# Patient Record
Sex: Female | Born: 1955 | Race: White | Hispanic: No | Marital: Married | Smoking: Never smoker
Health system: Southern US, Community
[De-identification: ages and names within clinical notes are randomized; demographics above are authoritative.]

---

## 2013-11-26 ENCOUNTER — Other Ambulatory Visit (HOSPITAL_COMMUNITY)
Admission: RE | Admit: 2013-11-26 | Discharge: 2013-11-26 | Disposition: A | Discharge status: Home / Self Care | Payer: BC Managed Care – PPO | Source: Ambulatory Visit | Attending: Family Medicine | Admitting: Family Medicine

## 2013-11-26 DIAGNOSIS — Z01419 Encounter for gynecological examination (general) (routine) without abnormal findings: Secondary | ICD-10-CM | POA: Insufficient documentation

## 2014-06-17 ENCOUNTER — Other Ambulatory Visit: Payer: Self-pay

## 2014-06-17 DIAGNOSIS — Z1231 Encounter for screening mammogram for malignant neoplasm of breast: Secondary | ICD-10-CM

## 2014-06-26 ENCOUNTER — Ambulatory Visit
Admission: RE | Admit: 2014-06-26 | Discharge: 2014-06-26 | Disposition: A | Discharge status: Home / Self Care | Payer: BC Managed Care – PPO | Source: Ambulatory Visit

## 2014-06-26 DIAGNOSIS — Z1231 Encounter for screening mammogram for malignant neoplasm of breast: Secondary | ICD-10-CM

## 2015-03-27 ENCOUNTER — Ambulatory Visit (INDEPENDENT_AMBULATORY_CARE_PROVIDER_SITE_OTHER): Payer: BC Managed Care – PPO | Admitting: Podiatry

## 2015-03-27 ENCOUNTER — Encounter: Payer: Self-pay | Admitting: Podiatry

## 2015-03-27 VITALS — BP 142/66 | HR 61 | Resp 16

## 2015-03-27 DIAGNOSIS — L603 Nail dystrophy: Secondary | ICD-10-CM | POA: Diagnosis not present

## 2015-03-27 NOTE — Patient Instructions (Signed)

## 2015-03-27 NOTE — Progress Notes (Signed)
   Subjective:    Patient ID: Megan Hutchinson, female    DOB: [birth year], 59 y.o.   MRN: [id number]  HPI Comments: "I have 2 thick nails"  Patient c/o tender 1st toenails bilateral for several months. The nails are thick and discolored. She has tried to clean around and under them. Used coconut oil-no help.     Review of Systems  All other systems reviewed and are negative.      Objective:   Physical Exam: I have reviewed her past medical history medications allergies surgery social history and review of systems. Pulses are strongly palpable bilateral. Neurologic sensorium is intact per Semmes-Weinstein monofilament. Deep tendon reflexes are intact bilateral muscle strength +5 over 5 dorsiflexion plantar flexors and inverters everters all intrinsic musculature is intact. Orthopedic evaluation demonstrates all joints distal to the ankle had a full range of motion without crepitation. Cutaneous evaluation of a straight supple well-hydrated cutis with no erythema edema saline as drainage or odor. The hallux nail plates appear to be thickened and have a lateral angular deviation. With subungual debris present and malodor.        Assessment & Plan:  Assessment: Nail dystrophy Rule out onychomycosis.  Plan: Sample of the nail and skin were taken today to be sent for pathologic evaluation we will notify her upon their return.

## 2015-05-13 ENCOUNTER — Encounter: Payer: Self-pay | Admitting: Podiatry

## 2015-05-13 ENCOUNTER — Ambulatory Visit (INDEPENDENT_AMBULATORY_CARE_PROVIDER_SITE_OTHER): Payer: BC Managed Care – PPO | Admitting: Podiatry

## 2015-05-13 VITALS — BP 131/68 | HR 71 | Resp 16

## 2015-05-13 DIAGNOSIS — L603 Nail dystrophy: Secondary | ICD-10-CM

## 2015-05-13 DIAGNOSIS — B351 Tinea unguium: Secondary | ICD-10-CM | POA: Diagnosis not present

## 2015-05-13 LAB — CBC WITH DIFFERENTIAL/PLATELET
Basophils Absolute: 0.1 10*3/uL (ref 0.0–0.1)
Basophils Relative: 1 % (ref 0–1)
EOS PCT: 6 % — AB (ref 0–5)
Eosinophils Absolute: 0.4 10*3/uL (ref 0.0–0.7)
HCT: 39.4 % (ref 36.0–46.0)
HEMOGLOBIN: 13.6 g/dL (ref 12.0–15.0)
LYMPHS ABS: 2.3 10*3/uL (ref 0.7–4.0)
LYMPHS PCT: 33 % (ref 12–46)
MCH: 31.1 pg (ref 26.0–34.0)
MCHC: 34.5 g/dL (ref 30.0–36.0)
MCV: 90 fL (ref 78.0–100.0)
MONOS PCT: 7 % (ref 3–12)
MPV: 9.7 fL (ref 8.6–12.4)
Monocytes Absolute: 0.5 10*3/uL (ref 0.1–1.0)
Neutro Abs: 3.8 10*3/uL (ref 1.7–7.7)
Neutrophils Relative %: 53 % (ref 43–77)
Platelets: 281 10*3/uL (ref 150–400)
RBC: 4.38 MIL/uL (ref 3.87–5.11)
RDW: 13.1 % (ref 11.5–15.5)
WBC: 7.1 10*3/uL (ref 4.0–10.5)

## 2015-05-13 LAB — HEPATIC FUNCTION PANEL
ALT: 18 U/L (ref 0–35)
AST: 16 U/L (ref 0–37)
Albumin: 4.2 g/dL (ref 3.5–5.2)
Alkaline Phosphatase: 46 U/L (ref 39–117)
BILIRUBIN INDIRECT: 0.3 mg/dL (ref 0.2–1.2)
Bilirubin, Direct: 0.1 mg/dL (ref 0.0–0.3)
TOTAL PROTEIN: 7.1 g/dL (ref 6.0–8.3)
Total Bilirubin: 0.4 mg/dL (ref 0.2–1.2)

## 2015-05-13 MED ORDER — ITRACONAZOLE 100 MG PO CAPS: 100.0000 mg | ORAL_CAPSULE | Freq: Two times a day (BID) | ORAL | Status: DC

## 2015-05-13 NOTE — Progress Notes (Signed)
Patient presents today for follow-up of fungal culture for the toenails.  Objective: Onychomycosis pathology report.  Assessment: Onychomycosis.  Plan: Treatment with Itraconazole 100 mg tablets 1 by mouth twice a day x 3 months. We have also requested blood work consisting of a liver profile and a CBC. Patient will be notified if results are abnormal. I will follow-up with patient in 1 month for another liver profile and CBC. Should she have any questions or concerns regarding this medication she will notify us immediately.

## 2015-05-13 NOTE — Patient Instructions (Signed)
Itraconazole capsules and tablets What is this medicine? ITRACONAZOLE (i tra KO na zole) is an antifungal medicine. It is used to treat certain kinds of fungal or yeast infections. This medicine may be used for other purposes; ask your health care provider or pharmacist if you have questions. COMMON BRAND NAME(S): ONMEL, Sporanox What should I tell my health care provider before I take this medicine? They need to know if you have any of these conditions: -heart disease, including angina or heart failure -history of irregular heartbeat -kidney disease or on dialysis -liver disease -lung disease -an unusual or allergic reaction to itraconazole, or other antifungal medicines, foods, dyes or preservatives -pregnant or trying to get pregnant -breast-feeding How should I use this medicine? Take this medicine by mouth with a full glass of water. Follow the directions on the prescription label. Take after eating a full meal. If you have a condition called achlorhydria, are taking H2-receptor antagonists or other gastric acid suppressors, you should take this medicine with a cola beverage. Take your doses at regular intervals. Do not take your medicine more often than directed. Do not stop taking except on your doctor's advice. Talk to your pediatrician regarding the use of this medicine in children. Special care may be needed. Overdosage: If you think you have taken too much of this medicine contact a poison control center or emergency room at once. NOTE: This medicine is only for you. Do not share this medicine with others. What if I miss a dose? If you miss a dose, take it as soon as you can. If it is almost time for your next dose, take only that dose. Do not take double or extra doses. What may interact with this medicine? Do not take this medicine with any of the following medications: -alfuzosin -cisapride -colchicine -ergot alkaloids like dihydroergotamine, ergonovine, ergotamine,  methylergonovine -methadone -nevirapine -pimozide -red yeast rice -sirolimus -some medicines for anxiety or sleep like alprazolam, clorazepate, flurazepam, midazolam, triazolam -some medicines for blood pressure like felodipine and nisoldipine -some medicines for cancer treatment like irinotecan -some medicines for cholesterol like atorvastatin, cerivastatin, lovastatin, simvastatin -some medicines for the heart like conivaptan, disopyramide, dofetilide, dronedarone, eplerenone, quinidine, ranolazine -some medicines for psychotic disturbances like lurasidone This medicine may also interact with the following medications: -alfentanil -antacids -antiviral medicines for HIV or AIDS -budesonide -buspirone -cilostazol -cyclosporine -digoxin -eletriptan -erythromycin -fentanyl -fluticasone -halofantrine -medicines for blood pressure like amlodipine and nifedipine -medicines for stomach problems like cimetidine, famotidine, omeprazole, lansoprazole -medicines for tuberculosis like isoniazid, INH, rifabutin, rifampin, rifapentine -medicines that treat or prevent blood clots like warfarin -methylprednisolone -other medicines for fungal infections -phenytoin, fosphenytoin -some medicines for diabetes -tacrolimus -trimetrexate This list may not describe all possible interactions. Give your health care provider a list of all the medicines, herbs, non-prescription drugs, or dietary supplements you use. Also tell them if you smoke, drink alcohol, or use illegal drugs. Some items may interact with your medicine. What should I watch for while using this medicine? Visit your doctor or health care professional for check ups. Tell your doctor or healthcare professional if your symptoms do not start to get better or if they get worse. Some fungal infections can take many weeks or months of treatment to cure. Avoid medicines for your stomach like antacids, anticholinergics, and acid blockers for at  least 2 hours after taking this medicine. You may get dizzy or blurred/double vision when taking this medicine. Do not drive, use machinery, or do anything that may be dangerous   until you know how the medicine affects you. Do not stand or sit up quickly, especially if you are an older patient. What side effects may I notice from receiving this medicine? Side effects that you should report to your doctor or health care professional as soon as possible: -allergic reactions like skin rash, itching or hives, swelling of the face, lips, or tongue -breathing problems -changes in hearing -cough up mucus -dark urine -fast, irregular heartbeat -general ill feeling or flu-like symptoms -light-colored stools -loss of appetite -nausea, vomiting -pain, tingling, numbness in the hands or feet -redness, blistering, peeling or loosening of the skin, including inside the mouth -right upper belly pain -sudden weight gain -swelling in feet, ankles, or legs -unusually weak or tired -yellowing of the eyes or skin Side effects that usually do not require medical attention (report to your doctor or health care professional if they continue or are bothersome): -blurred/double vision -diarrhea -dizziness -headache -stomach upset or bloating This list may not describe all possible side effects. Call your doctor for medical advice about side effects. You may report side effects to FDA at 1-800-FDA-1088. Where should I keep my medicine? Keep out of the reach of children. Store at room temperature between 15 and 25 degrees C (59 and 77 degrees F). Protect from light and moisture. Throw away any unused medicine after the expiration date. NOTE: This sheet is a summary. It may not cover all possible information. If you have questions about this medicine, talk to your doctor, pharmacist, or health care provider.  2015, [employer]. (2013-05-21 17:02:46)  

## 2015-05-15 ENCOUNTER — Telehealth: Payer: Self-pay | Admitting: *Deleted

## 2015-05-15 DIAGNOSIS — Z79899 Other long term (current) drug therapy: Secondary | ICD-10-CM

## 2015-05-15 NOTE — Telephone Encounter (Signed)
I informed pt 05/13/2015 Hepatic function and CBC with diff were within normal limits and she could begin her medication, and would need a check in 1 month.  Pt states it cost her $60.00 each visit, could she have the 1 month labs without the visit.  Dr. Maren Beach.  Reordered labs and mailed to pt.

## 2015-05-29 ENCOUNTER — Other Ambulatory Visit: Payer: Self-pay

## 2015-05-29 DIAGNOSIS — Z1231 Encounter for screening mammogram for malignant neoplasm of breast: Secondary | ICD-10-CM

## 2015-06-12 ENCOUNTER — Ambulatory Visit: Payer: BC Managed Care – PPO | Admitting: Podiatry

## 2015-06-21 LAB — HEPATIC FUNCTION PANEL
ALT: 16 U/L (ref 6–29)
AST: 14 U/L (ref 10–35)
Albumin: 4.2 g/dL (ref 3.6–5.1)
Alkaline Phosphatase: 44 U/L (ref 33–130)
BILIRUBIN INDIRECT: 0.4 mg/dL (ref 0.2–1.2)
Bilirubin, Direct: 0.1 mg/dL (ref <=0.2)
Total Bilirubin: 0.5 mg/dL (ref 0.2–1.2)
Total Protein: 7.2 g/dL (ref 6.1–8.1)

## 2015-06-21 LAB — CBC WITH DIFFERENTIAL/PLATELET
BASOS PCT: 1 % (ref 0–1)
Basophils Absolute: 0.1 10*3/uL (ref 0.0–0.1)
EOS ABS: 0.4 10*3/uL (ref 0.0–0.7)
EOS PCT: 6 % — AB (ref 0–5)
HCT: 41 % (ref 36.0–46.0)
Hemoglobin: 13.9 g/dL (ref 12.0–15.0)
LYMPHS ABS: 2.2 10*3/uL (ref 0.7–4.0)
Lymphocytes Relative: 33 % (ref 12–46)
MCH: 30.5 pg (ref 26.0–34.0)
MCHC: 33.9 g/dL (ref 30.0–36.0)
MCV: 90.1 fL (ref 78.0–100.0)
MONOS PCT: 6 % (ref 3–12)
MPV: 9.5 fL (ref 8.6–12.4)
Monocytes Absolute: 0.4 10*3/uL (ref 0.1–1.0)
NEUTROS PCT: 54 % (ref 43–77)
Neutro Abs: 3.7 10*3/uL (ref 1.7–7.7)
PLATELETS: 263 10*3/uL (ref 150–400)
RBC: 4.55 MIL/uL (ref 3.87–5.11)
RDW: 13.5 % (ref 11.5–15.5)
WBC: 6.8 10*3/uL (ref 4.0–10.5)

## 2015-06-23 NOTE — Telephone Encounter (Signed)
Called and informed pt.  

## 2015-06-24 ENCOUNTER — Telehealth: Payer: Self-pay | Admitting: *Deleted

## 2015-06-25 NOTE — Telephone Encounter (Signed)
Entered in error

## 2015-07-07 ENCOUNTER — Ambulatory Visit: Payer: BC Managed Care – PPO

## 2015-08-06 ENCOUNTER — Ambulatory Visit
Admission: RE | Admit: 2015-08-06 | Discharge: 2015-08-06 | Disposition: A | Discharge status: Home / Self Care | Payer: BC Managed Care – PPO | Source: Ambulatory Visit

## 2015-08-06 DIAGNOSIS — Z1231 Encounter for screening mammogram for malignant neoplasm of breast: Secondary | ICD-10-CM

## 2017-02-23 ENCOUNTER — Other Ambulatory Visit (HOSPITAL_COMMUNITY)
Admission: RE | Admit: 2017-02-23 | Discharge: 2017-02-23 | Disposition: A | Discharge status: Home / Self Care | Payer: BC Managed Care – PPO | Source: Ambulatory Visit | Attending: Family Medicine | Admitting: Family Medicine

## 2017-02-23 ENCOUNTER — Other Ambulatory Visit: Payer: Self-pay | Admitting: Family Medicine

## 2017-02-23 DIAGNOSIS — Z1151 Encounter for screening for human papillomavirus (HPV): Secondary | ICD-10-CM | POA: Insufficient documentation

## 2017-02-23 DIAGNOSIS — Z01411 Encounter for gynecological examination (general) (routine) with abnormal findings: Secondary | ICD-10-CM | POA: Insufficient documentation

## 2017-03-01 LAB — CYTOLOGY - PAP
Diagnosis: NEGATIVE
HPV: NOT DETECTED

## 2017-06-02 ENCOUNTER — Other Ambulatory Visit: Payer: Self-pay | Admitting: Family Medicine

## 2017-06-02 DIAGNOSIS — Z1231 Encounter for screening mammogram for malignant neoplasm of breast: Secondary | ICD-10-CM

## 2017-06-10 ENCOUNTER — Ambulatory Visit
Admission: RE | Admit: 2017-06-10 | Discharge: 2017-06-10 | Disposition: A | Discharge status: Home / Self Care | Payer: BC Managed Care – PPO | Source: Ambulatory Visit | Attending: Family Medicine | Admitting: Family Medicine

## 2017-06-10 DIAGNOSIS — Z1231 Encounter for screening mammogram for malignant neoplasm of breast: Secondary | ICD-10-CM

## 2018-10-05 ENCOUNTER — Other Ambulatory Visit: Payer: Self-pay | Admitting: Family Medicine

## 2018-10-05 DIAGNOSIS — Z1231 Encounter for screening mammogram for malignant neoplasm of breast: Secondary | ICD-10-CM

## 2018-12-04 ENCOUNTER — Ambulatory Visit
Admission: RE | Admit: 2018-12-04 | Discharge: 2018-12-04 | Disposition: A | Discharge status: Home / Self Care | Payer: BC Managed Care – PPO | Source: Ambulatory Visit | Attending: Family Medicine | Admitting: Family Medicine

## 2018-12-04 DIAGNOSIS — Z1231 Encounter for screening mammogram for malignant neoplasm of breast: Secondary | ICD-10-CM

## 2020-04-03 ENCOUNTER — Ambulatory Visit: Payer: BC Managed Care – PPO | Admitting: Podiatry

## 2020-07-16 ENCOUNTER — Emergency Department (HOSPITAL_COMMUNITY)
Admission: EM | Admit: 2020-07-16 | Discharge: 2020-07-17 | Disposition: A | Discharge status: Home / Self Care | Payer: BC Managed Care – PPO | Attending: Emergency Medicine | Admitting: Emergency Medicine

## 2020-07-16 ENCOUNTER — Emergency Department (HOSPITAL_COMMUNITY): Payer: BC Managed Care – PPO

## 2020-07-16 ENCOUNTER — Other Ambulatory Visit: Payer: Self-pay

## 2020-07-16 ENCOUNTER — Encounter (HOSPITAL_COMMUNITY): Payer: Self-pay | Admitting: *Deleted

## 2020-07-16 DIAGNOSIS — Y9289 Other specified places as the place of occurrence of the external cause: Secondary | ICD-10-CM | POA: Insufficient documentation

## 2020-07-16 DIAGNOSIS — R2231 Localized swelling, mass and lump, right upper limb: Secondary | ICD-10-CM | POA: Diagnosis not present

## 2020-07-16 DIAGNOSIS — Y939 Activity, unspecified: Secondary | ICD-10-CM | POA: Diagnosis not present

## 2020-07-16 DIAGNOSIS — X58XXXA Exposure to other specified factors, initial encounter: Secondary | ICD-10-CM | POA: Diagnosis not present

## 2020-07-16 DIAGNOSIS — M7989 Other specified soft tissue disorders: Secondary | ICD-10-CM

## 2020-07-16 DIAGNOSIS — Y999 Unspecified external cause status: Secondary | ICD-10-CM | POA: Diagnosis not present

## 2020-07-16 DIAGNOSIS — R030 Elevated blood-pressure reading, without diagnosis of hypertension: Secondary | ICD-10-CM | POA: Diagnosis not present

## 2020-07-16 DIAGNOSIS — T148XXA Other injury of unspecified body region, initial encounter: Secondary | ICD-10-CM

## 2020-07-16 DIAGNOSIS — S60221A Contusion of right hand, initial encounter: Secondary | ICD-10-CM | POA: Insufficient documentation

## 2020-07-16 DIAGNOSIS — M79643 Pain in unspecified hand: Secondary | ICD-10-CM | POA: Diagnosis present

## 2020-07-16 NOTE — ED Triage Notes (Signed)
Pt states that she was brushing her teeth and noticed that her right posterior hand started swelling. She denies injury. C/o discomfort in the hand. She is concerned something bit her or she has a blood clot. Palpable radial pulses.

## 2020-07-17 NOTE — Discharge Instructions (Signed)
Thank you for allowing me to care for you today in the Emergency Department.   You were seen in the ER today for swelling of your right hand.  Swelling appears consistent with a hematoma.  We applied ice and elevated your right arm in the ER today and the area has already started to improve.  At home, try to elevate your hand so that it is at or above the level of your heart to help with pain and swelling.  Apply an ice pack to the area as frequently as needed over the next few days, at least 3-4 times for least 15 to 20 minutes to help with pain and swelling.  Ace wraps are available over-the-counter at places such as [facility] or [facility].  You could wrap this around the hands where it is swollen with gentle compression.  This can help to protect the area as well as to help with swelling.  It will take several days for this to resolve.  Try not to reinjure the area while you are at work as this may cause worsening pain and swelling.  Please follow-up with your primary care clinician as her blood pressure was elevated today.  It did improve while you are here, but please try to have it rechecked within the next week.  You should return to the emergency department if you have significantly worsening swelling that extends up the fingers or arm, if you develop uncontrollable bleeding, if you start having spontaneous bleeding from other areas, if the hand or arm gets extremely swollen and firm to the touch, or if you develop other new, concerning symptoms.

## 2020-07-17 NOTE — ED Provider Notes (Signed)
[facility]-EMERGENCY DEPT Provider Note   CSN: [id number] Arrival date & time: 07/16/20  2059     History Chief Complaint  Patient presents with  . Hand Problem    Megan Hutchinson is a 64 y.o. female with no significant past medical history who presents to the emergency department with a chief complaint of right hand swelling.  The patient reports that she noted sudden onset, worsening swelling to the dorsum of the right hand earlier tonight while she was brushing her teeth with her electric toothbrush.  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  She reports that the swelling was initially worsening, but has remained constant for the last hour and a half.  She denies drainage from the wound, numbness, weakness, right wrist or pain in the fingers of the right hand, fever, chills.  No other rashes, spontaneous bleeding from the mouth, hematemesis, melena, hematochezia.  She does not take any blood thinners.  The history is provided by the patient. No language interpreter was used.       History reviewed. No pertinent past medical history.  There are no problems to display for this patient.   History reviewed. No pertinent surgical history.   OB History   No obstetric history on file.     No family history on file.  Social History   Tobacco Use  . Smoking status: Never Smoker  Substance Use Topics  . Alcohol use: Yes    Alcohol/week: 0.0 standard drinks  . Drug use: Not on file    Home Medications Prior to Admission medications   Not on File    Allergies    Patient has no known allergies.  Review of Systems   Review of Systems  Physical Exam Updated Vital Signs BP (!) 155/85   Pulse 61   Temp 98 F (36.7 C) (Oral)   Resp 16   Ht 5\' 7"  (1.702 m)   Wt 86.2 kg   SpO2 100%   BMI 29.76 kg/m   Physical Exam Vitals and nursing note reviewed.    Constitutional:      General: She is not in acute distress. HENT:     Head: Normocephalic.  Eyes:     Conjunctiva/sclera: Conjunctivae normal.  Cardiovascular:     Rate and Rhythm: Normal rate and regular rhythm.     Heart sounds: No murmur heard.  No friction rub. No gallop.   Pulmonary:     Effort: Pulmonary effort is normal. No respiratory distress.  Abdominal:     General: There is no distension.     Palpations: Abdomen is soft.  Musculoskeletal:     Cervical back: Neck supple.  Skin:    General: Skin is warm.     Findings: No rash.     Comments: Ecchymosis noted to the dorsum of the right hand with associated swelling.  Hand is warm and well-perfused with good capillary refill.  Full active and passive range of motion of all digits of the right hand and right wrist.  Radial pulses are 2+ and symmetric.  Sensation is intact and equal throughout the bilateral upper extremities.  No petechiae or purpura.  Neurological:     Mental Status: She is alert.  Psychiatric:        Behavior: Behavior normal.            ED Results / Procedures /  Treatments   Labs (all labs ordered are listed, but only abnormal results are displayed) Labs Reviewed - No data to display  EKG None  Radiology DG Hand Complete Right  Result Date: 07/16/2020 CLINICAL DATA:  Hand swelling EXAM: RIGHT HAND - COMPLETE 3+ VIEW COMPARISON:  None. FINDINGS: There is no evidence of fracture or dislocation. There is no evidence of arthropathy or other focal bone abnormality. Soft tissue swelling is present. IMPRESSION: No acute osseous abnormality Electronically Signed   By: Jasmine Pang M.D.   On: 07/16/2020 22:41    Procedures Ultrasound ED Soft Tissue  Date/Time: 07/17/2020 8:21 AM Performed by: Barkley Boards, PA-C Authorized by: Barkley Boards, PA-C   Procedure details:    Indications: limb pain     Transverse view:  Visualized   Longitudinal view:  Visualized   Images: archived    Location:    Location: upper extremity     Side:  Right Findings:     no abscess present    no cellulitis present    no foreign body present   (including critical care time)  Medications Ordered in ED Medications - No data to display  ED Course  I have reviewed the triage vital signs and the nursing notes.  Pertinent labs & imaging results that were available during my care of the patient were reviewed by me and considered in my medical decision making (see chart for details).    MDM Rules/Calculators/A&P                          64 year old female with no significant past medical history presenting with sudden onset ecchymosis and swelling to the dorsum of the right hand.  Patient does not recall any specific trauma, but does note that she was moving heavy crates there most of the day, but the swelling significantly worsened after using her electric toothbrush earlier in the evening.  She is not anticoagulated.  No other spontaneous bleeding.  Exam is consistent with a hematoma to the right hand.  Although patient cannot recall any specific trauma, she did have a significant amount of lifting, moving, and carrying objects at work earlier in the day.  On my evaluation, patient reports that swelling and bruising is not worsened for more than an hour and a half.  Bedside ultrasound performed with out evidence of cellulitis or abscess.  Discussed with Dr. Preston Fleeting, attending physician.  No foreign bodies.  The area was marked with a skin marker.  She elevated the right hand and applied an ice pack.  She was observed for approximately 15 minutes and on reevaluation swelling had started to improve.  Discussed with the patient elevation, ice, and compression for hematoma.  Her blood pressure was also elevated today.  No history of hypertension.  She was advised to follow-up with primary care for recheck.  I have a low suspicion for upper extremity DVT, cellulitis, abscess.  She is  hemodynamically stable and in no acute distress.  ER return precautions given.  Safe discharge to home with close observation of symptoms.  Final Clinical Impression(s) / ED Diagnoses Final diagnoses:  Hematoma  Swelling of right hand  Elevated blood pressure reading    Rx / DC Orders ED Discharge Orders    None       Barkley Boards, PA-C 07/17/20 0825    Dione Booze, MD 07/17/20 2203

## 2020-12-06 ENCOUNTER — Other Ambulatory Visit: Payer: Self-pay | Admitting: Family Medicine

## 2020-12-06 DIAGNOSIS — M8580 Other specified disorders of bone density and structure, unspecified site: Secondary | ICD-10-CM

## 2020-12-06 DIAGNOSIS — Z1231 Encounter for screening mammogram for malignant neoplasm of breast: Secondary | ICD-10-CM

## 2021-04-02 ENCOUNTER — Other Ambulatory Visit: Payer: Self-pay

## 2021-04-02 ENCOUNTER — Ambulatory Visit
Admission: RE | Admit: 2021-04-02 | Discharge: 2021-04-02 | Disposition: A | Discharge status: Home / Self Care | Payer: BC Managed Care – PPO | Source: Ambulatory Visit | Attending: Family Medicine | Admitting: Family Medicine

## 2021-04-02 DIAGNOSIS — Z1231 Encounter for screening mammogram for malignant neoplasm of breast: Secondary | ICD-10-CM

## 2021-04-02 DIAGNOSIS — M8580 Other specified disorders of bone density and structure, unspecified site: Secondary | ICD-10-CM

## 2021-10-24 IMAGING — MG MM DIGITAL SCREENING BILAT W/ TOMO AND CAD
6 of 10 series · 6 of 30 positions shown · non-contrast
Comparison: Previous exam(s).

CLINICAL DATA: Screening.

EXAM:
DIGITAL SCREENING BILATERAL MAMMOGRAM WITH TOMOSYNTHESIS AND CAD
TECHNIQUE: Bilateral screening digital craniocaudal and mediolateral oblique
mammograms were obtained. Bilateral screening digital breast
tomosynthesis was performed. The images were evaluated with
computer-aided detection.

[L CC synth-2D]
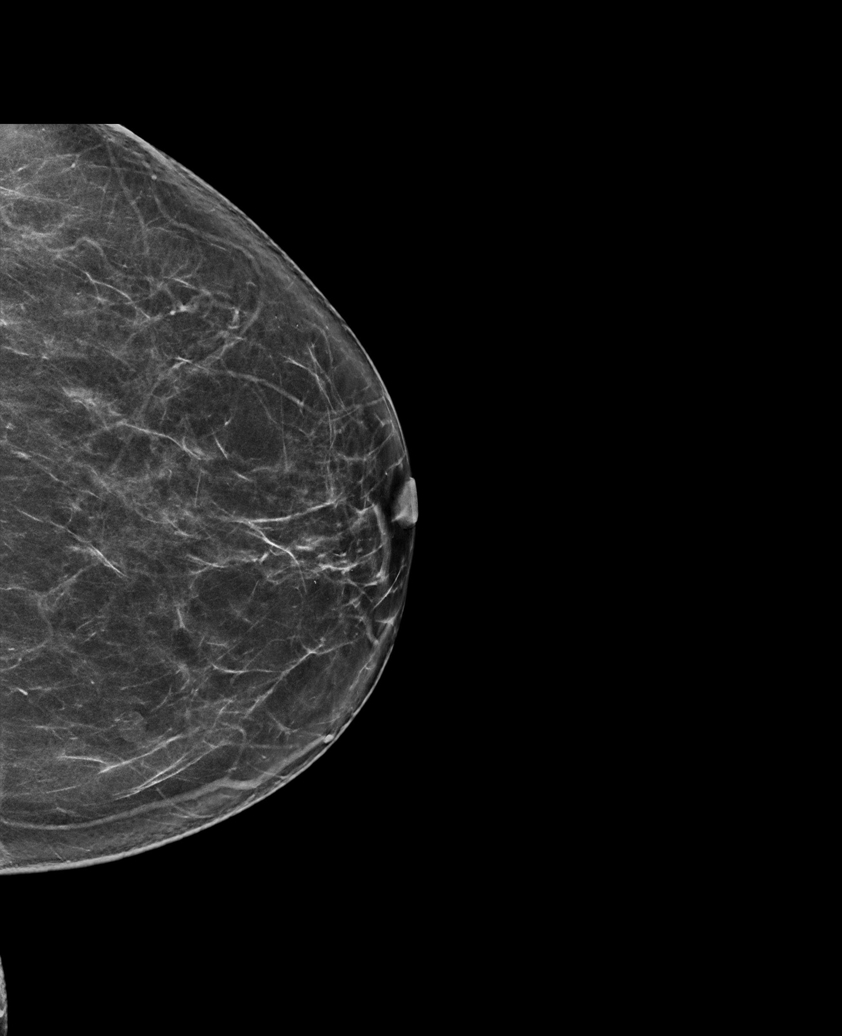

[R MLO synth-2D (1 of 2)]
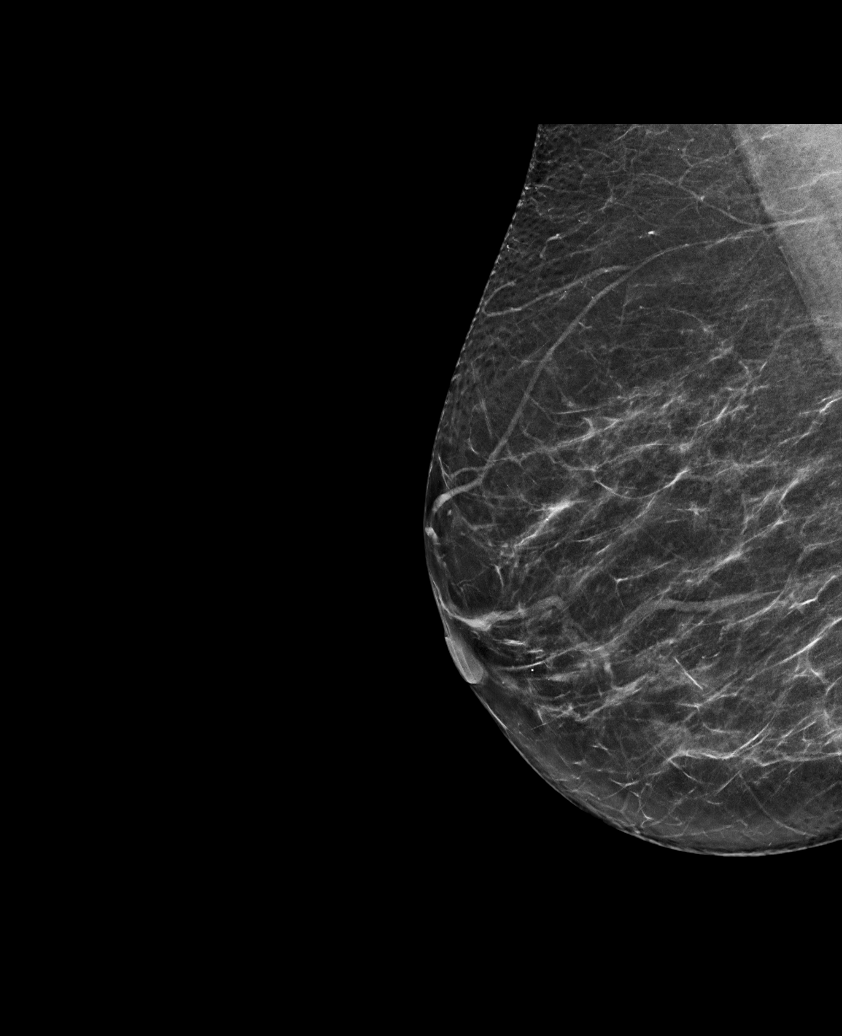

[L MLO synth-2D]
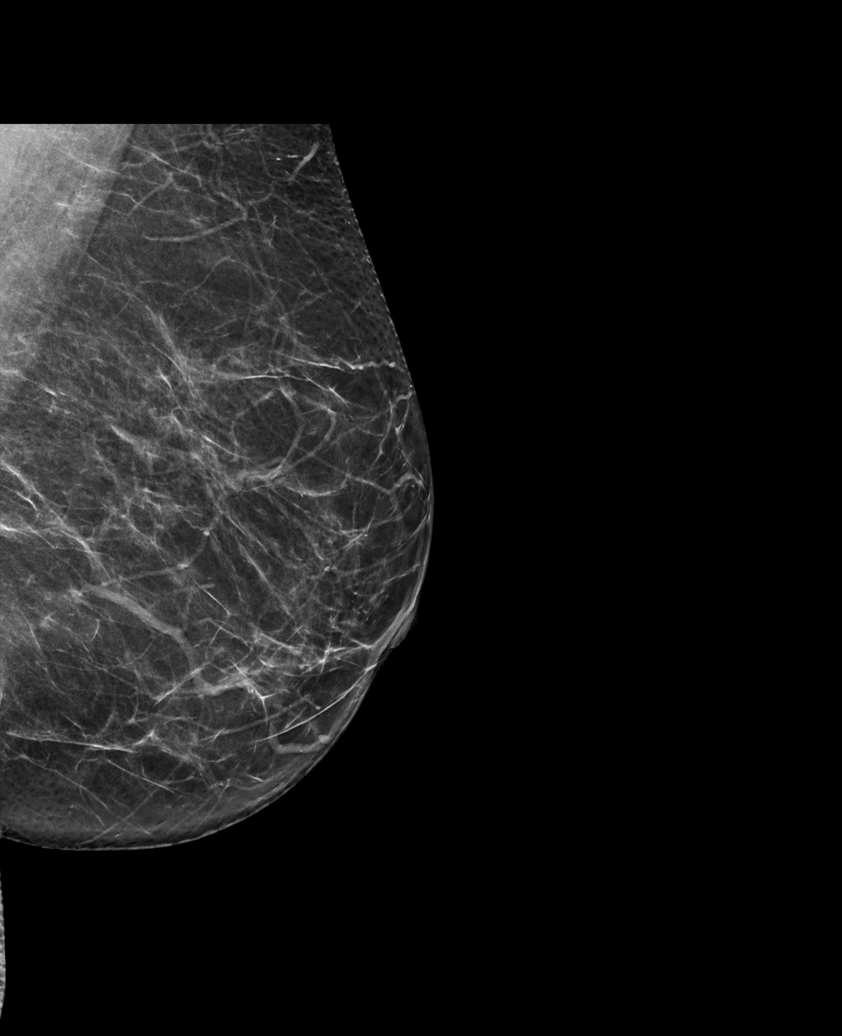

[R MLO synth-2D (2 of 2)]
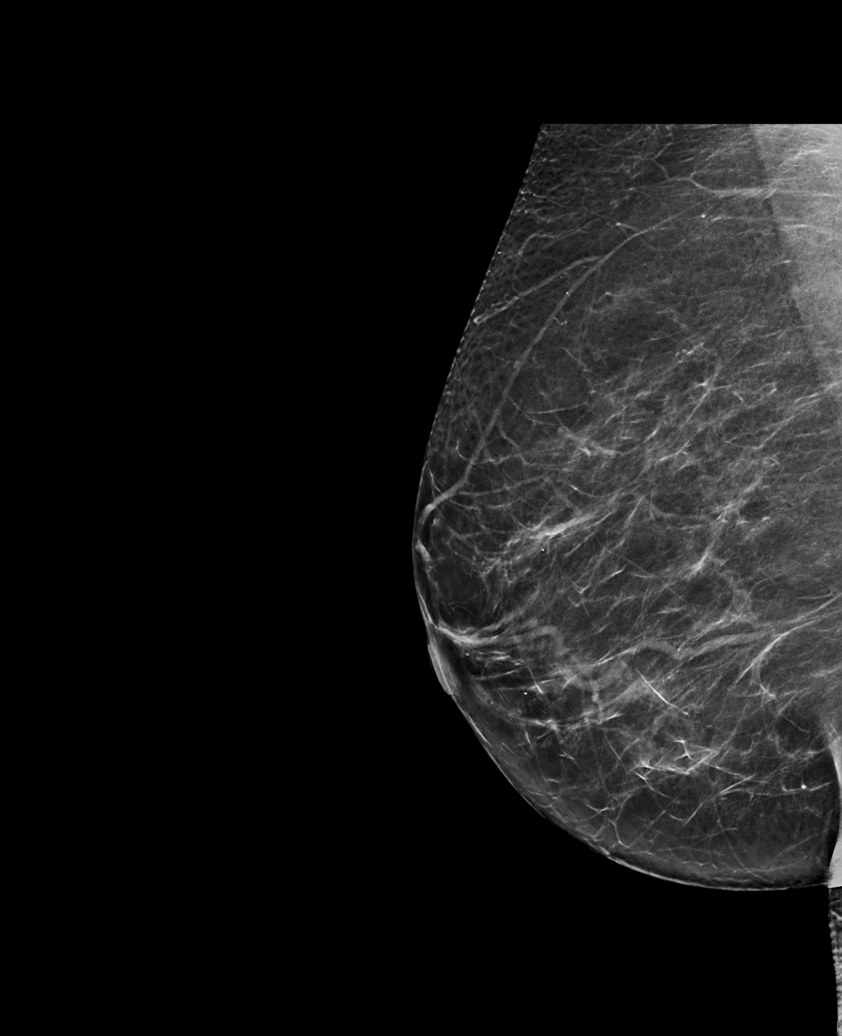

[R CC synth-2D]
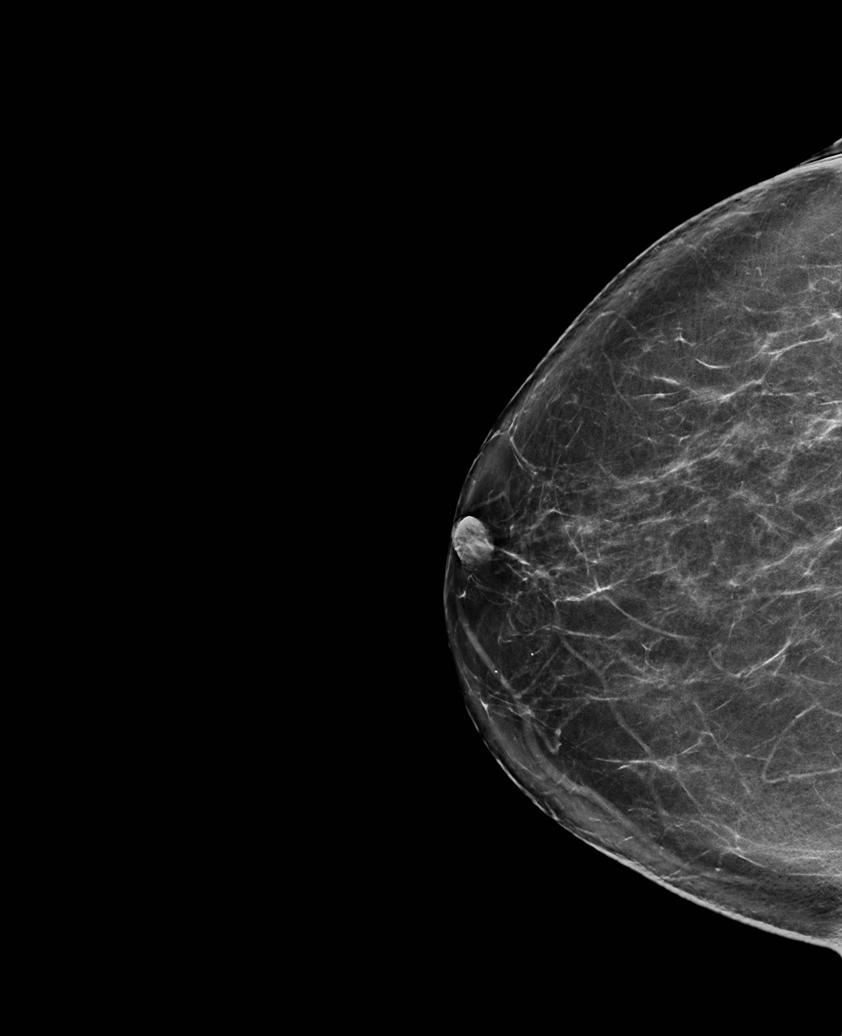

[R MLO tomo · tomo slice 35/70.0]
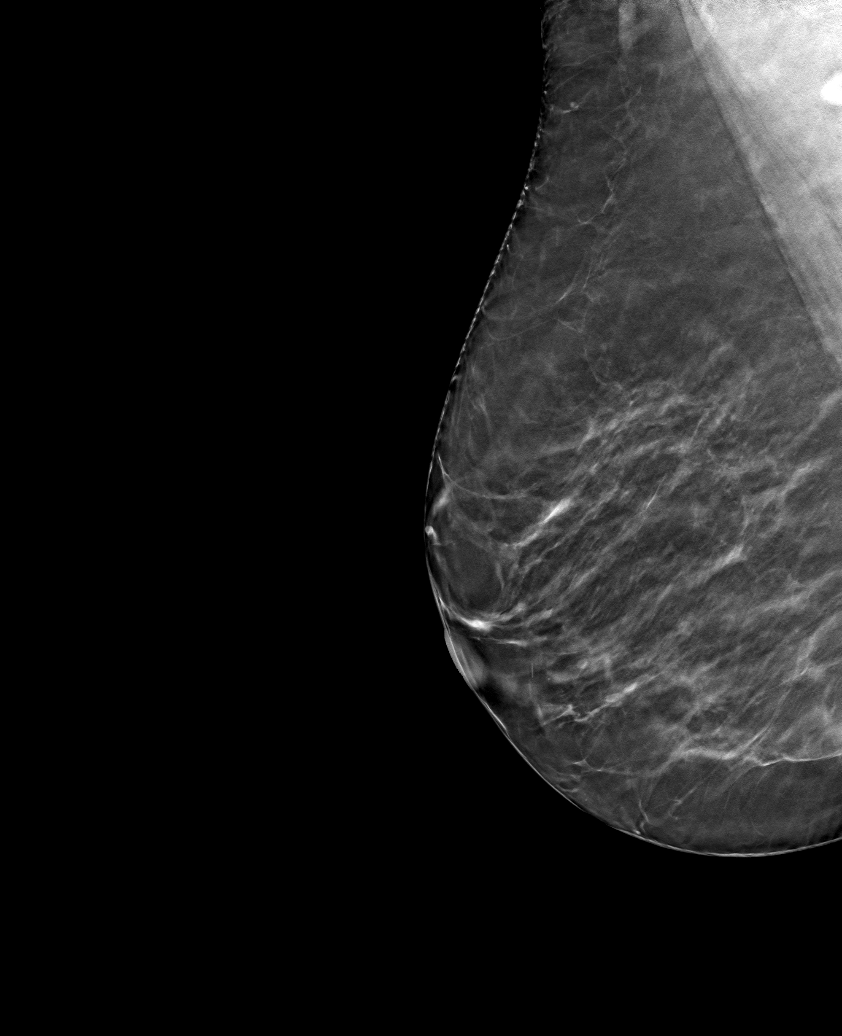

[6 of 30 positions shown; findings below may reference images not displayed]

ACR Breast Density Category b: There are scattered areas of
fibroglandular density.
FINDINGS: There are no findings suspicious for malignancy. The images were
evaluated with computer-aided detection.
IMPRESSION: No mammographic evidence of malignancy. A result letter of this
screening mammogram will be mailed directly to the patient.

RECOMMENDATION:
Screening mammogram in one year. (Code:WJ-I-BG6)

BI-RADS CATEGORY  1: Negative.

## 2022-11-29 DIAGNOSIS — Z1331 Encounter for screening for depression: Secondary | ICD-10-CM | POA: Diagnosis not present

## 2022-11-29 DIAGNOSIS — R0981 Nasal congestion: Secondary | ICD-10-CM | POA: Diagnosis not present

## 2022-11-29 DIAGNOSIS — I10 Essential (primary) hypertension: Secondary | ICD-10-CM | POA: Diagnosis not present

## 2022-11-29 DIAGNOSIS — K219 Gastro-esophageal reflux disease without esophagitis: Secondary | ICD-10-CM | POA: Diagnosis not present

## 2022-11-29 DIAGNOSIS — R300 Dysuria: Secondary | ICD-10-CM | POA: Diagnosis not present

## 2022-12-27 DIAGNOSIS — I10 Essential (primary) hypertension: Secondary | ICD-10-CM | POA: Diagnosis not present

## 2022-12-31 DIAGNOSIS — M25511 Pain in right shoulder: Secondary | ICD-10-CM | POA: Diagnosis not present

## 2022-12-31 DIAGNOSIS — M542 Cervicalgia: Secondary | ICD-10-CM | POA: Diagnosis not present

## 2023-02-01 DIAGNOSIS — M8580 Other specified disorders of bone density and structure, unspecified site: Secondary | ICD-10-CM | POA: Diagnosis not present

## 2023-02-01 DIAGNOSIS — M2550 Pain in unspecified joint: Secondary | ICD-10-CM | POA: Diagnosis not present

## 2023-02-01 DIAGNOSIS — M1990 Unspecified osteoarthritis, unspecified site: Secondary | ICD-10-CM | POA: Diagnosis not present

## 2023-02-01 DIAGNOSIS — R7982 Elevated C-reactive protein (CRP): Secondary | ICD-10-CM | POA: Diagnosis not present

## 2023-02-01 DIAGNOSIS — M25511 Pain in right shoulder: Secondary | ICD-10-CM | POA: Diagnosis not present

## 2023-02-01 DIAGNOSIS — M25512 Pain in left shoulder: Secondary | ICD-10-CM | POA: Diagnosis not present

## 2023-02-01 DIAGNOSIS — M79641 Pain in right hand: Secondary | ICD-10-CM | POA: Diagnosis not present

## 2023-02-01 DIAGNOSIS — Z1159 Encounter for screening for other viral diseases: Secondary | ICD-10-CM | POA: Diagnosis not present

## 2023-02-01 DIAGNOSIS — M130 Polyarthritis, unspecified: Secondary | ICD-10-CM | POA: Diagnosis not present

## 2023-02-01 DIAGNOSIS — M79672 Pain in left foot: Secondary | ICD-10-CM | POA: Diagnosis not present

## 2023-02-01 DIAGNOSIS — M79671 Pain in right foot: Secondary | ICD-10-CM | POA: Diagnosis not present

## 2023-02-01 DIAGNOSIS — M79642 Pain in left hand: Secondary | ICD-10-CM | POA: Diagnosis not present

## 2023-02-15 DIAGNOSIS — I129 Hypertensive chronic kidney disease with stage 1 through stage 4 chronic kidney disease, or unspecified chronic kidney disease: Secondary | ICD-10-CM | POA: Diagnosis not present

## 2023-02-15 DIAGNOSIS — R300 Dysuria: Secondary | ICD-10-CM | POA: Diagnosis not present

## 2023-03-15 DIAGNOSIS — M1990 Unspecified osteoarthritis, unspecified site: Secondary | ICD-10-CM | POA: Diagnosis not present

## 2023-03-15 DIAGNOSIS — R768 Other specified abnormal immunological findings in serum: Secondary | ICD-10-CM | POA: Diagnosis not present

## 2023-03-15 DIAGNOSIS — M2550 Pain in unspecified joint: Secondary | ICD-10-CM | POA: Diagnosis not present

## 2023-03-15 DIAGNOSIS — M8580 Other specified disorders of bone density and structure, unspecified site: Secondary | ICD-10-CM | POA: Diagnosis not present

## 2023-05-04 ENCOUNTER — Other Ambulatory Visit: Payer: Self-pay | Admitting: Family Medicine

## 2023-05-04 DIAGNOSIS — Z1231 Encounter for screening mammogram for malignant neoplasm of breast: Secondary | ICD-10-CM

## 2023-05-06 ENCOUNTER — Ambulatory Visit
Admission: RE | Admit: 2023-05-06 | Discharge: 2023-05-06 | Disposition: A | Discharge status: Home / Self Care | Payer: Medicare HMO | Source: Ambulatory Visit | Attending: Family Medicine | Admitting: Family Medicine

## 2023-05-06 DIAGNOSIS — Z1231 Encounter for screening mammogram for malignant neoplasm of breast: Secondary | ICD-10-CM | POA: Diagnosis not present

## 2023-06-13 DIAGNOSIS — Z85820 Personal history of malignant melanoma of skin: Secondary | ICD-10-CM | POA: Diagnosis not present

## 2023-06-13 DIAGNOSIS — N1831 Chronic kidney disease, stage 3a: Secondary | ICD-10-CM | POA: Diagnosis not present

## 2023-06-13 DIAGNOSIS — Z1331 Encounter for screening for depression: Secondary | ICD-10-CM | POA: Diagnosis not present

## 2023-06-13 DIAGNOSIS — M8588 Other specified disorders of bone density and structure, other site: Secondary | ICD-10-CM | POA: Diagnosis not present

## 2023-06-13 DIAGNOSIS — Z0000 Encounter for general adult medical examination without abnormal findings: Secondary | ICD-10-CM | POA: Diagnosis not present

## 2023-06-13 DIAGNOSIS — Z1159 Encounter for screening for other viral diseases: Secondary | ICD-10-CM | POA: Diagnosis not present

## 2023-06-13 DIAGNOSIS — B001 Herpesviral vesicular dermatitis: Secondary | ICD-10-CM | POA: Diagnosis not present

## 2023-06-13 DIAGNOSIS — I129 Hypertensive chronic kidney disease with stage 1 through stage 4 chronic kidney disease, or unspecified chronic kidney disease: Secondary | ICD-10-CM | POA: Diagnosis not present

## 2023-06-13 DIAGNOSIS — Z124 Encounter for screening for malignant neoplasm of cervix: Secondary | ICD-10-CM | POA: Diagnosis not present

## 2023-06-13 DIAGNOSIS — Z13220 Encounter for screening for lipoid disorders: Secondary | ICD-10-CM | POA: Diagnosis not present

## 2023-06-13 DIAGNOSIS — Z136 Encounter for screening for cardiovascular disorders: Secondary | ICD-10-CM | POA: Diagnosis not present

## 2023-06-14 DIAGNOSIS — M2550 Pain in unspecified joint: Secondary | ICD-10-CM | POA: Diagnosis not present

## 2023-06-14 DIAGNOSIS — R768 Other specified abnormal immunological findings in serum: Secondary | ICD-10-CM | POA: Diagnosis not present

## 2023-06-14 DIAGNOSIS — M1990 Unspecified osteoarthritis, unspecified site: Secondary | ICD-10-CM | POA: Diagnosis not present

## 2023-06-14 DIAGNOSIS — M8580 Other specified disorders of bone density and structure, unspecified site: Secondary | ICD-10-CM | POA: Diagnosis not present

## 2023-08-18 DIAGNOSIS — D229 Melanocytic nevi, unspecified: Secondary | ICD-10-CM | POA: Diagnosis not present

## 2023-08-18 DIAGNOSIS — Z86018 Personal history of other benign neoplasm: Secondary | ICD-10-CM | POA: Diagnosis not present

## 2023-08-18 DIAGNOSIS — L821 Other seborrheic keratosis: Secondary | ICD-10-CM | POA: Diagnosis not present

## 2023-08-18 DIAGNOSIS — L814 Other melanin hyperpigmentation: Secondary | ICD-10-CM | POA: Diagnosis not present

## 2023-08-18 DIAGNOSIS — D1801 Hemangioma of skin and subcutaneous tissue: Secondary | ICD-10-CM | POA: Diagnosis not present

## 2023-08-18 DIAGNOSIS — Z85820 Personal history of malignant melanoma of skin: Secondary | ICD-10-CM | POA: Diagnosis not present

## 2023-08-18 DIAGNOSIS — D485 Neoplasm of uncertain behavior of skin: Secondary | ICD-10-CM | POA: Diagnosis not present

## 2023-08-18 DIAGNOSIS — L859 Epidermal thickening, unspecified: Secondary | ICD-10-CM | POA: Diagnosis not present

## 2023-08-18 DIAGNOSIS — L820 Inflamed seborrheic keratosis: Secondary | ICD-10-CM | POA: Diagnosis not present

## 2023-10-24 DIAGNOSIS — H02883 Meibomian gland dysfunction of right eye, unspecified eyelid: Secondary | ICD-10-CM | POA: Diagnosis not present

## 2023-10-24 DIAGNOSIS — H2513 Age-related nuclear cataract, bilateral: Secondary | ICD-10-CM | POA: Diagnosis not present

## 2023-10-24 DIAGNOSIS — H02886 Meibomian gland dysfunction of left eye, unspecified eyelid: Secondary | ICD-10-CM | POA: Diagnosis not present

## 2023-12-16 DIAGNOSIS — I10 Essential (primary) hypertension: Secondary | ICD-10-CM | POA: Diagnosis not present

## 2023-12-22 DIAGNOSIS — D485 Neoplasm of uncertain behavior of skin: Secondary | ICD-10-CM | POA: Diagnosis not present

## 2023-12-22 DIAGNOSIS — L820 Inflamed seborrheic keratosis: Secondary | ICD-10-CM | POA: Diagnosis not present
